# Patient Record
Sex: Female | Born: 1955 | Hispanic: Yes | Marital: Married | Smoking: Never smoker
Health system: Southern US, Community
[De-identification: ages and names within clinical notes are randomized; demographics above are authoritative.]

## PROBLEM LIST (undated history)

## (undated) DIAGNOSIS — E119 Type 2 diabetes mellitus without complications: Secondary | ICD-10-CM

## (undated) DIAGNOSIS — E079 Disorder of thyroid, unspecified: Secondary | ICD-10-CM

---

## 2006-05-06 ENCOUNTER — Ambulatory Visit: Payer: Self-pay

## 2006-05-12 ENCOUNTER — Encounter: Payer: Self-pay | Admitting: Family Medicine

## 2006-05-16 ENCOUNTER — Encounter: Payer: Self-pay | Admitting: Family Medicine

## 2007-09-08 ENCOUNTER — Ambulatory Visit: Payer: Self-pay

## 2013-08-18 ENCOUNTER — Ambulatory Visit: Payer: Self-pay

## 2014-04-27 DIAGNOSIS — M2550 Pain in unspecified joint: Secondary | ICD-10-CM | POA: Insufficient documentation

## 2014-04-27 DIAGNOSIS — G5600 Carpal tunnel syndrome, unspecified upper limb: Secondary | ICD-10-CM | POA: Insufficient documentation

## 2014-04-27 DIAGNOSIS — R768 Other specified abnormal immunological findings in serum: Secondary | ICD-10-CM | POA: Insufficient documentation

## 2016-07-29 ENCOUNTER — Encounter: Payer: Self-pay | Admitting: Emergency Medicine

## 2016-07-29 ENCOUNTER — Emergency Department
Admission: EM | Admit: 2016-07-29 | Discharge: 2016-07-29 | Disposition: A | Discharge status: Home / Self Care | Payer: Self-pay | Attending: Emergency Medicine | Admitting: Emergency Medicine

## 2016-07-29 DIAGNOSIS — E119 Type 2 diabetes mellitus without complications: Secondary | ICD-10-CM | POA: Insufficient documentation

## 2016-07-29 DIAGNOSIS — R21 Rash and other nonspecific skin eruption: Secondary | ICD-10-CM | POA: Insufficient documentation

## 2016-07-29 HISTORY — DX: Disorder of thyroid, unspecified: E07.9

## 2016-07-29 HISTORY — DX: Type 2 diabetes mellitus without complications: E11.9

## 2016-07-29 MED ORDER — HYDROXYZINE HCL 25 MG PO TABS: 25.0000 mg | ORAL_TABLET | Freq: Three times a day (TID) | ORAL | 0 refills | Status: AC | PRN

## 2016-07-29 MED ORDER — PREDNISONE 10 MG PO TABS: 60.0000 mg | ORAL_TABLET | Freq: Every day | ORAL | 0 refills | Status: AC

## 2016-07-29 NOTE — Discharge Instructions (Signed)
Please watch your blood sugar closely while you are taking the Prednisone. It may go up. Stay on a strict diabetic diet.  Follow up with the dermatologist if symptoms are not improving over the next 2 weeks.   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

## 2016-07-29 NOTE — ED Notes (Signed)
See triage note   States she developed generalized rash with itching  Was seen by PCP about 1 month ago   But meds did not work  No resp issues noted

## 2016-07-29 NOTE — ED Notes (Signed)
Pt needs interpreter.

## 2016-07-29 NOTE — ED Triage Notes (Addendum)
Pt states that she has had a rash x4 months and that now she is not able to sleep at night due to itching of rash. Pt has rash on her lower left arm and most of the left leg. Pt is ambulatory to triage with NAD noted at this time.

## 2016-07-29 NOTE — ED Provider Notes (Signed)
[facility] Emergency Department Provider Note  ____________________________________________  Time seen: Approximately 7:24 AM  I have reviewed the triage vital signs and the nursing notes.   HISTORY  Chief Complaint Rash   HPI Illana Shamar Kracke is a 61 y.o. female who presents to the emergency department for treatment of a rash that has been present for the past 4 months. Rash started on knees and has now spread "all over." Itching no worse at night than through the day. She has been seen at "the clinic" and was given a pill to be taken as needed and a cream to put on the rash. She used it for a month without any relief.   Past Medical History:  Diagnosis Date  . Diabetes mellitus without complication ([place])   . Thyroid disease     There are no active problems to display for this patient.   History reviewed. No pertinent surgical history.  Prior to Admission medications   Medication Sig Start Date End Date Taking? Authorizing Provider  hydrOXYzine (ATARAX/VISTARIL) 25 MG tablet Take 1 tablet (25 mg total) by mouth 3 (three) times daily as needed. 07/29/16   Victorino Dike, FNP  predniSONE (DELTASONE) 10 MG tablet Take 6 tablets (60 mg total) by mouth daily. 07/29/16   Victorino Dike, FNP    Allergies Patient has no known allergies.  No family history on file.  Social History Social History  Substance Use Topics  . Smoking status: Never Smoker  . Smokeless tobacco: Never Used  . Alcohol use No    Review of Systems  Constitutional: Negative for fever/chills Respiratory: Negative for shortness of breath. Musculoskeletal: Negative for pain. Skin: Positive for widespread rash. Neurological: Negative for headaches, focal weakness or numbness. ____________________________________________   PHYSICAL EXAM:  VITAL SIGNS: ED Triage Vitals  Enc Vitals Group     BP 07/29/16 0530 (!) 145/55     Pulse Rate 07/29/16 0530 74     Resp  07/29/16 0530 18     Temp 07/29/16 0530 98.2 F (36.8 C)     Temp Source 07/29/16 0530 Oral     SpO2 07/29/16 0530 96 %     Weight 07/29/16 0531 183 lb (83 kg)     Height 07/29/16 0531 5\' 3"  (1.6 m)     Head Circumference --      Peak Flow --      Pain Score 07/29/16 0529 8     Pain Loc --      Pain Edu? --      Excl. in [place]? --      Constitutional: Alert and oriented. Well appearing and in no acute distress. Eyes: Conjunctivae are normal. EOMI. Nose: No congestion/rhinnorhea. Mouth/Throat: Mucous membranes are moist.   Neck: No stridor. Lymphatic: No cervical lymphadenopathy. Cardiovascular: Good peripheral circulation. Respiratory: Normal respiratory effort.  No retractions. Lungs clear. Musculoskeletal: FROM throughout. Neurologic:  Normal speech and language. No gross focal neurologic deficits are appreciated. Skin: Maculopapular, confluent rash on erythematous base covering the trunk and all 4 extremities, sparing the face, scalp, neck, hands, and feet.  ____________________________________________   LABS (all labs ordered are listed, but only abnormal results are displayed)  Labs Reviewed - No data to display ____________________________________________  EKG   ____________________________________________  RADIOLOGY  Not indicated. ____________________________________________   PROCEDURES  Procedure(s) performed: None ____________________________________________   INITIAL IMPRESSION / ASSESSMENT AND PLAN / ED COURSE   Pertinent labs & imaging results that were available during my care  of the patient were reviewed by me and considered in my medical decision making (see chart for details).   ____________________________________________   FINAL CLINICAL IMPRESSION(S) / ED DIAGNOSES  Final diagnoses:  Rash and nonspecific skin eruption    New Prescriptions   HYDROXYZINE (ATARAX/VISTARIL) 25 MG TABLET    Take 1 tablet (25 mg total) by mouth 3  (three) times daily as needed.   PREDNISONE (DELTASONE) 10 MG TABLET    Take 6 tablets (60 mg total) by mouth daily.    If controlled substance prescribed during this visit, 12 month history viewed on the [place] prior to issuing an initial prescription for Schedule II or III opiod.   Note:  This document was prepared using Dragon voice recognition software and may include unintentional dictation errors.    Victorino Dike, FNP 07/29/16 [place], MD 07/30/16 [phone]

## 2017-01-02 ENCOUNTER — Other Ambulatory Visit: Payer: Self-pay | Admitting: Family Medicine

## 2017-01-02 DIAGNOSIS — Z1231 Encounter for screening mammogram for malignant neoplasm of breast: Secondary | ICD-10-CM

## 2019-01-19 ENCOUNTER — Other Ambulatory Visit: Payer: Self-pay | Admitting: Family Medicine

## 2019-01-19 DIAGNOSIS — Z1231 Encounter for screening mammogram for malignant neoplasm of breast: Secondary | ICD-10-CM

## 2019-04-21 ENCOUNTER — Ambulatory Visit
Admission: RE | Admit: 2019-04-21 | Discharge: 2019-04-21 | Disposition: A | Discharge status: Home / Self Care | Payer: Medicare Other | Source: Ambulatory Visit | Attending: Family Medicine | Admitting: Family Medicine

## 2019-04-21 DIAGNOSIS — Z1231 Encounter for screening mammogram for malignant neoplasm of breast: Secondary | ICD-10-CM | POA: Insufficient documentation

## 2019-11-19 ENCOUNTER — Other Ambulatory Visit: Payer: Self-pay | Admitting: Family Medicine

## 2019-11-19 DIAGNOSIS — Z13820 Encounter for screening for osteoporosis: Secondary | ICD-10-CM

## 2020-01-04 ENCOUNTER — Other Ambulatory Visit: Payer: Medicare Other

## 2020-01-26 ENCOUNTER — Ambulatory Visit
Admission: RE | Admit: 2020-01-26 | Discharge: 2020-01-26 | Disposition: A | Discharge status: Home / Self Care | Payer: Medicare Other | Source: Ambulatory Visit | Attending: Family Medicine | Admitting: Family Medicine

## 2020-01-26 ENCOUNTER — Other Ambulatory Visit: Payer: Self-pay

## 2020-01-26 DIAGNOSIS — Z13820 Encounter for screening for osteoporosis: Secondary | ICD-10-CM | POA: Insufficient documentation

## 2020-01-26 DIAGNOSIS — E039 Hypothyroidism, unspecified: Secondary | ICD-10-CM | POA: Diagnosis not present

## 2020-01-26 DIAGNOSIS — Z780 Asymptomatic menopausal state: Secondary | ICD-10-CM | POA: Diagnosis not present

## 2021-04-16 IMAGING — MG DIGITAL SCREENING BILAT W/ TOMO W/ CAD
8 series · 8 of 24 positions shown · non-contrast
Comparison: Previous exam(s).

CLINICAL DATA: Screening.

EXAM:
DIGITAL SCREENING BILATERAL MAMMOGRAM WITH TOMO AND CAD

[R CC synth-2D]
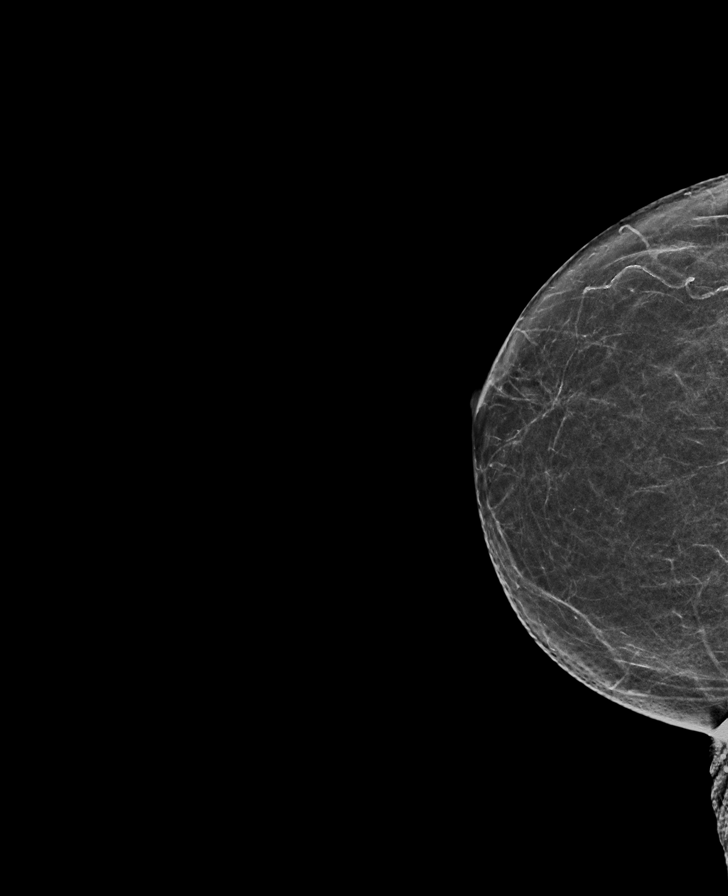

[R MLO synth-2D]
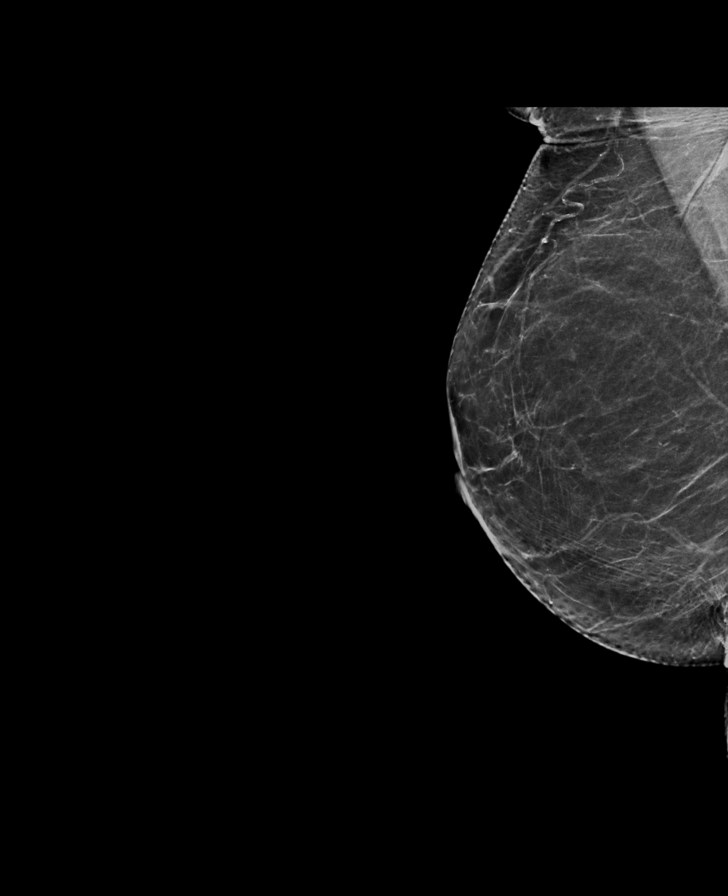

[L MLO synth-2D]
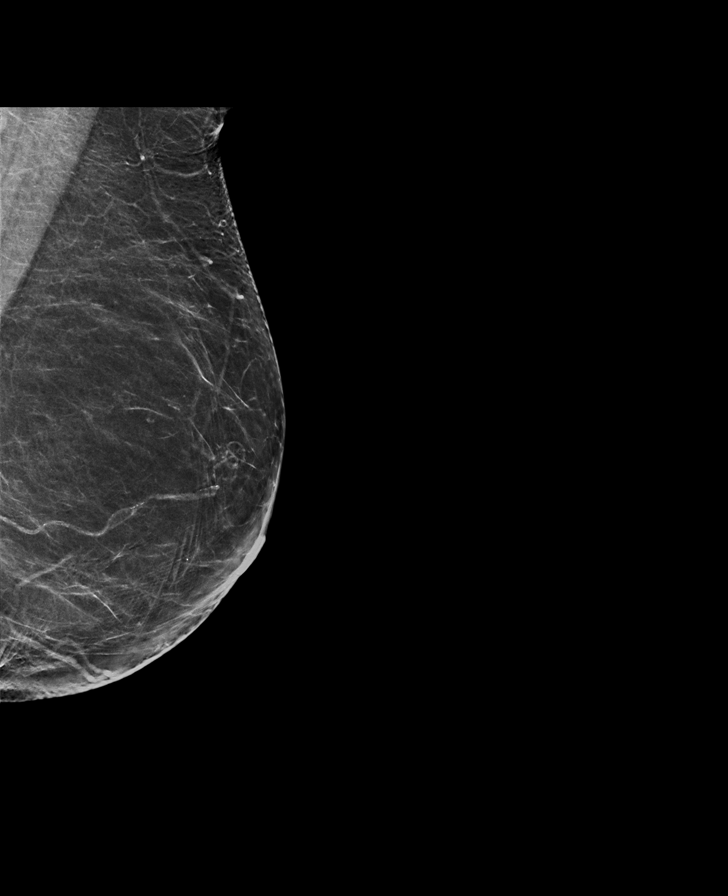

[L CC synth-2D]
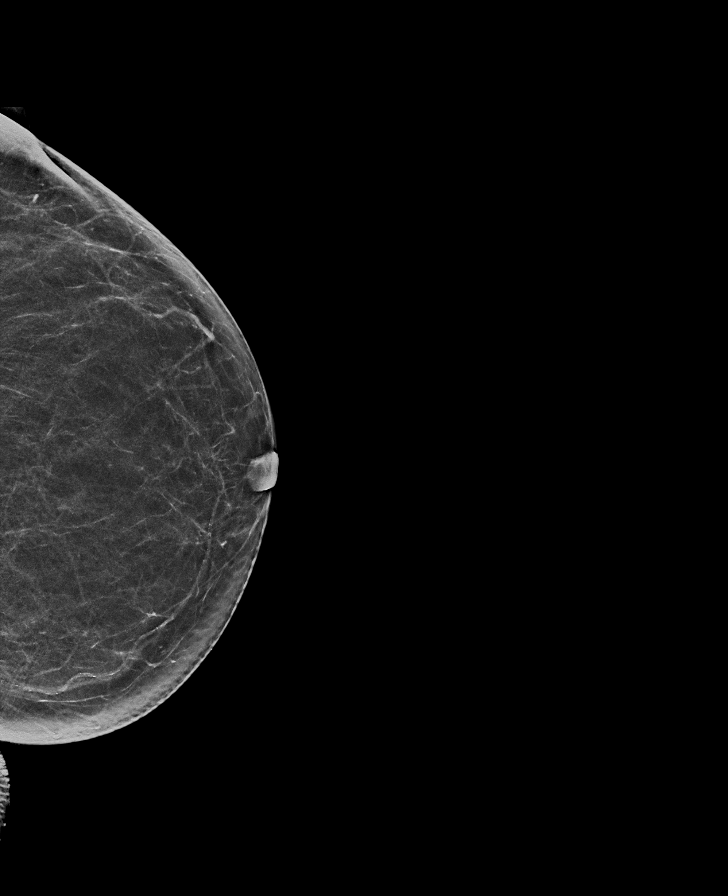

[L CC tomo · tomo slice 31/62.0]
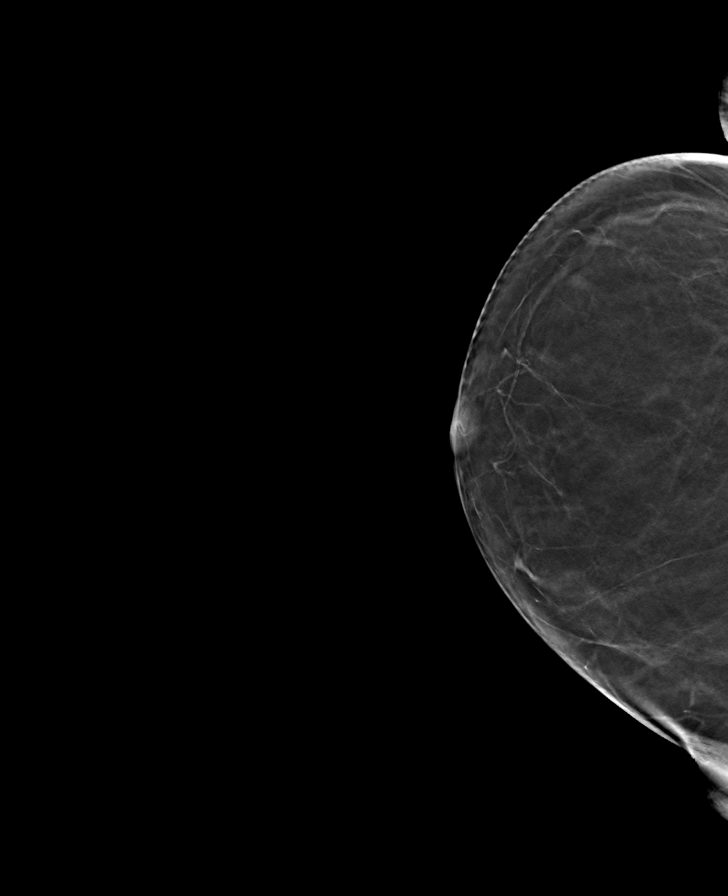

[R CC tomo · tomo slice 29/58.0]
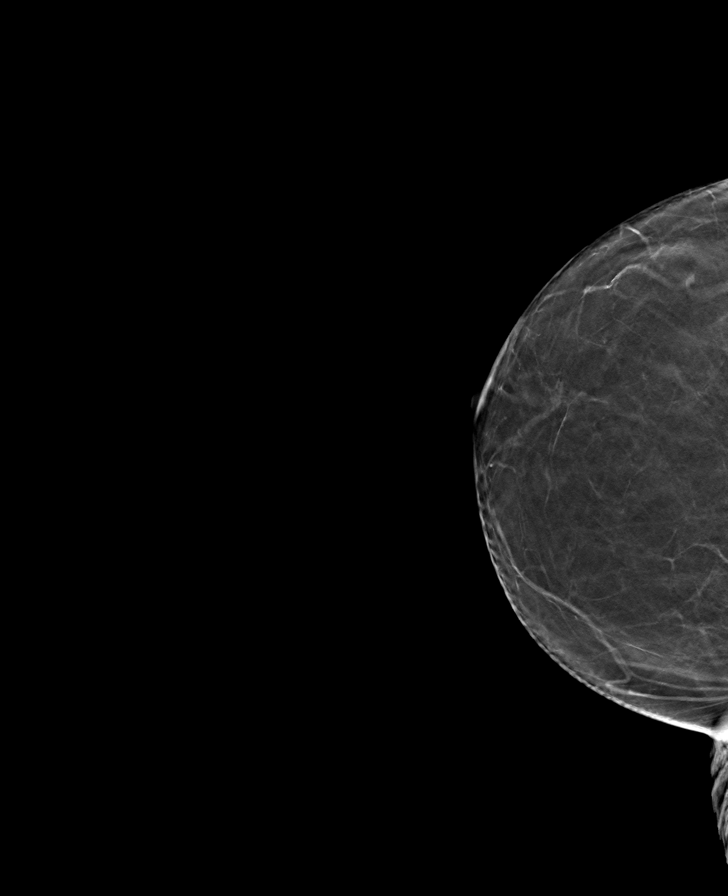

[L MLO tomo · tomo slice 37/72.0]
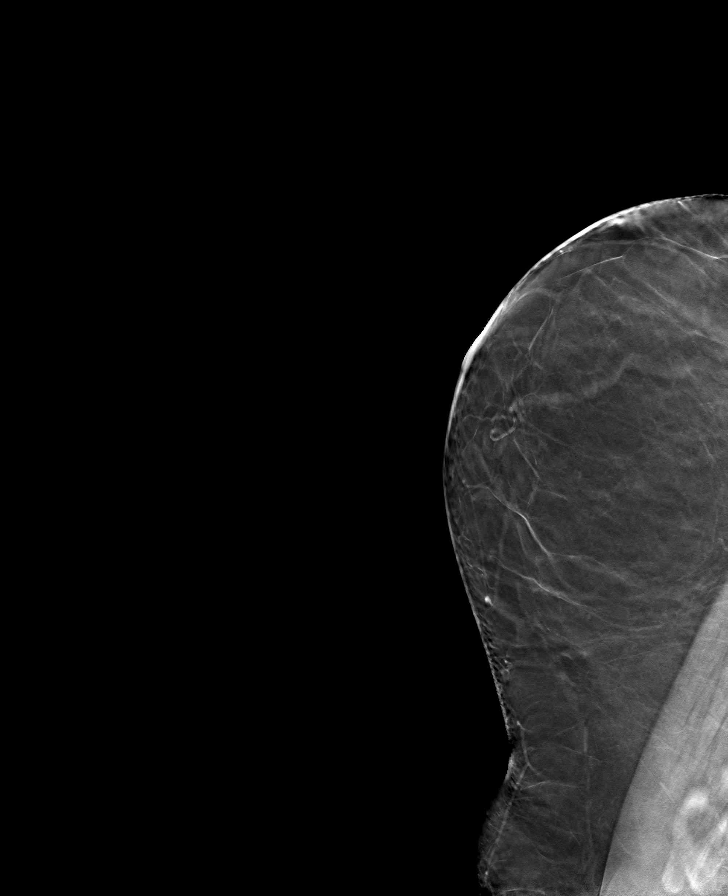

[R MLO tomo · tomo slice 33/65.0]
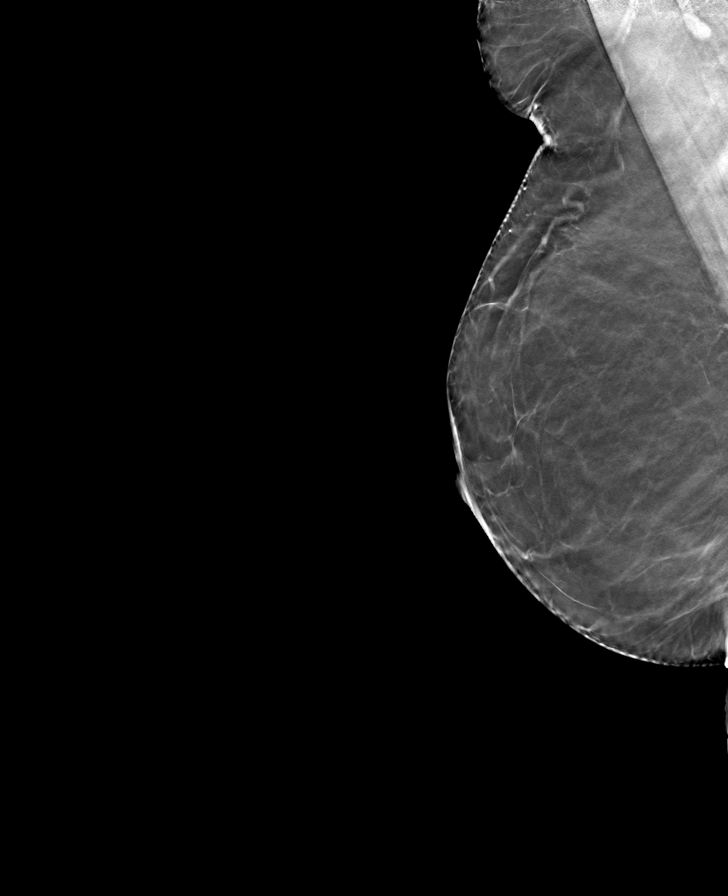

[8 of 24 positions shown; findings below may reference images not displayed]

ACR Breast Density Category b: There are scattered areas of
fibroglandular density.
FINDINGS: There are no findings suspicious for malignancy. Images were
processed with CAD.
IMPRESSION: No mammographic evidence of malignancy. A result letter of this
screening mammogram will be mailed directly to the patient.

RECOMMENDATION:
Screening mammogram in one year. (Code:[id number]-U-775)

BI-RADS CATEGORY  1: Negative.

## 2022-01-08 ENCOUNTER — Other Ambulatory Visit: Payer: Self-pay | Admitting: Nurse Practitioner

## 2022-01-08 DIAGNOSIS — E782 Mixed hyperlipidemia: Secondary | ICD-10-CM | POA: Insufficient documentation

## 2022-01-08 DIAGNOSIS — E119 Type 2 diabetes mellitus without complications: Secondary | ICD-10-CM | POA: Insufficient documentation

## 2022-01-08 DIAGNOSIS — Z1231 Encounter for screening mammogram for malignant neoplasm of breast: Secondary | ICD-10-CM

## 2022-01-28 ENCOUNTER — Other Ambulatory Visit: Payer: Self-pay

## 2022-01-28 ENCOUNTER — Telehealth: Payer: Self-pay

## 2022-01-28 DIAGNOSIS — Z1211 Encounter for screening for malignant neoplasm of colon: Secondary | ICD-10-CM

## 2022-01-28 MED ORDER — NA SULFATE-K SULFATE-MG SULF 17.5-3.13-1.6 GM/177ML PO SOLN: 1.0000 | Freq: Once | ORAL | 0 refills | Status: AC

## 2022-01-28 NOTE — Telephone Encounter (Signed)
Gastroenterology Pre-Procedure Review  Request Date: 02/21/22 Requesting Physician: Dr. Vicente Males  PATIENT REVIEW QUESTIONS: The patient gave verbal permission for her daughter Mickel Baas to complete triage .   1. Are you having any GI issues? no 2. Do you have a personal history of Polyps? no 3. Do you have a family history of Colon Cancer or Polyps? no 4. Diabetes Mellitus? yes (type 2 has been advised to hold Metformin 2 days prior to colonoscopy, Jardiance 3 days prior to colonoscopy.) 5. Joint replacements in the past 12 months?no 6. Major health problems in the past 3 months?no 7. Any artificial heart valves, MVP, or defibrillator?no    MEDICATIONS & ALLERGIES:    Patient reports the following regarding taking any anticoagulation/antiplatelet therapy:   Plavix, Coumadin, Eliquis, Xarelto, Lovenox, Pradaxa, Brilinta, or Effient? no Aspirin? no  Patient confirms/reports the following medications:  Current Outpatient Medications  Medication Sig Dispense Refill   hydrOXYzine (ATARAX/VISTARIL) 25 MG tablet Take 1 tablet (25 mg total) by mouth 3 (three) times daily as needed. 30 tablet 0   predniSONE (DELTASONE) 10 MG tablet Take 6 tablets (60 mg total) by mouth daily. 63 tablet 0   No current facility-administered medications for this visit.    Patient confirms/reports the following allergies:  No Known Allergies  No orders of the defined types were placed in this encounter.   AUTHORIZATION INFORMATION Primary Insurance: 1D#: Group #:  Secondary Insurance: 1D#: Group #:  SCHEDULE INFORMATION: Date: 03/04/22 Time: Location: [facility]

## 2022-02-21 ENCOUNTER — Ambulatory Visit: Payer: Medicare Other | Admitting: Certified Registered Nurse Anesthetist

## 2022-02-21 ENCOUNTER — Ambulatory Visit
Admission: RE | Admit: 2022-02-21 | Discharge: 2022-02-21 | Disposition: A | Discharge status: Home / Self Care | Payer: Medicare Other | Source: Ambulatory Visit | Attending: Gastroenterology | Admitting: Gastroenterology

## 2022-02-21 ENCOUNTER — Other Ambulatory Visit: Payer: Self-pay

## 2022-02-21 ENCOUNTER — Encounter
Admission: RE | Disposition: A | Discharge status: Home / Self Care | Payer: Self-pay | Source: Ambulatory Visit | Attending: Gastroenterology

## 2022-02-21 DIAGNOSIS — Z79890 Hormone replacement therapy (postmenopausal): Secondary | ICD-10-CM | POA: Insufficient documentation

## 2022-02-21 DIAGNOSIS — Z1211 Encounter for screening for malignant neoplasm of colon: Secondary | ICD-10-CM | POA: Diagnosis not present

## 2022-02-21 DIAGNOSIS — E119 Type 2 diabetes mellitus without complications: Secondary | ICD-10-CM | POA: Insufficient documentation

## 2022-02-21 DIAGNOSIS — D126 Benign neoplasm of colon, unspecified: Secondary | ICD-10-CM

## 2022-02-21 DIAGNOSIS — E079 Disorder of thyroid, unspecified: Secondary | ICD-10-CM | POA: Insufficient documentation

## 2022-02-21 DIAGNOSIS — E039 Hypothyroidism, unspecified: Secondary | ICD-10-CM | POA: Diagnosis not present

## 2022-02-21 DIAGNOSIS — D122 Benign neoplasm of ascending colon: Secondary | ICD-10-CM | POA: Insufficient documentation

## 2022-02-21 DIAGNOSIS — Z7984 Long term (current) use of oral hypoglycemic drugs: Secondary | ICD-10-CM | POA: Diagnosis not present

## 2022-02-21 DIAGNOSIS — Z87891 Personal history of nicotine dependence: Secondary | ICD-10-CM | POA: Insufficient documentation

## 2022-02-21 HISTORY — PX: COLONOSCOPY WITH PROPOFOL: SHX5780

## 2022-02-21 LAB — GLUCOSE, CAPILLARY: Glucose-Capillary: 134 mg/dL — ABNORMAL HIGH (ref 70–99)

## 2022-02-21 SURGERY — COLONOSCOPY WITH PROPOFOL
Anesthesia: General

## 2022-02-21 MED ORDER — SODIUM CHLORIDE 0.9 % IV SOLN: INTRAVENOUS | Status: DC

## 2022-02-21 MED ORDER — PROPOFOL 10 MG/ML IV BOLUS
INTRAVENOUS | Status: DC | PRN
  Administered 2022-02-21: 50 mg via INTRAVENOUS

## 2022-02-21 MED ORDER — PROPOFOL 500 MG/50ML IV EMUL
INTRAVENOUS | Status: DC | PRN
  Administered 2022-02-21: 140 ug/kg/min via INTRAVENOUS

## 2022-02-21 MED ORDER — STERILE WATER FOR IRRIGATION IR SOLN
Status: DC | PRN
  Administered 2022-02-21: 50 mL

## 2022-02-21 MED ORDER — LIDOCAINE HCL (CARDIAC) PF 100 MG/5ML IV SOSY
PREFILLED_SYRINGE | INTRAVENOUS | Status: DC | PRN
  Administered 2022-02-21: 50 mg via INTRAVENOUS

## 2022-02-21 NOTE — Transfer of Care (Signed)
Immediate Anesthesia Transfer of Care Note  Patient: Danielle Noble  Procedure(s) Performed: COLONOSCOPY WITH PROPOFOL  Patient Location: PACU and Endoscopy Unit  Anesthesia Type:General  Level of Consciousness: drowsy  Airway & Oxygen Therapy: Patient Spontanous Breathing  Post-op Assessment: Report given to RN and Post -op Vital signs reviewed and stable  Post vital signs: Reviewed and stable  Last Vitals:  Vitals Value Taken Time  BP 119/55   Temp    Pulse 65   Resp 15   SpO2 100     Last Pain:  Vitals:   02/21/22 0955  TempSrc: Temporal  PainSc: 0-No pain         Complications: No notable events documented.

## 2022-02-21 NOTE — Anesthesia Preprocedure Evaluation (Signed)
Anesthesia Evaluation  Patient identified by MRN, date of birth, ID band Patient awake    Reviewed: Allergy & Precautions, NPO status , Patient's Chart, lab work & pertinent test results  History of Anesthesia Complications Negative for: history of anesthetic complications  Airway Mallampati: II  TM Distance: >3 FB Neck ROM: Full    Dental no notable dental hx. (+) Teeth Intact   Pulmonary neg pulmonary ROS, neg sleep apnea, neg COPD, Patient abstained from smoking.Not current smoker   Pulmonary exam normal breath sounds clear to auscultation       Cardiovascular Exercise Tolerance: Good METS(-) hypertension(-) CAD and (-) Past MI negative cardio ROS (-) dysrhythmias  Rhythm:Regular Rate:Normal - Systolic murmurs    Neuro/Psych negative neurological ROS  negative psych ROS   GI/Hepatic ,neg GERD  ,,(+)     (-) substance abuse    Endo/Other  diabetes, Oral Hypoglycemic AgentsHypothyroidism    Renal/GU negative Renal ROS     Musculoskeletal   Abdominal   Peds  Hematology   Anesthesia Other Findings Past Medical History: No date: Diabetes mellitus without complication (HCC) No date: Thyroid disease  Reproductive/Obstetrics                              Anesthesia Physical Anesthesia Plan  ASA: 2  Anesthesia Plan: General   Post-op Pain Management: Minimal or no pain anticipated   Induction: Intravenous  PONV Risk Score and Plan: 3 and Propofol infusion, TIVA and Ondansetron  Airway Management Planned: Nasal Cannula  Additional Equipment: None  Intra-op Plan:   Post-operative Plan:   Informed Consent: I have reviewed the patients History and Physical, chart, labs and discussed the procedure including the risks, benefits and alternatives for the proposed anesthesia with the patient or authorized representative who has indicated his/her understanding and acceptance.      Dental advisory given and Interpreter used for interveiw (in person spanish interpreter [place] at bedside)  Plan Discussed with: CRNA and Surgeon  Anesthesia Plan Comments: (Discussed risks of anesthesia with patient, including possibility of difficulty with spontaneous ventilation under anesthesia necessitating airway intervention, PONV, and rare risks such as cardiac or respiratory or neurological events, and allergic reactions. Discussed the role of CRNA in patient's perioperative care. Patient understands.)         Anesthesia Quick Evaluation

## 2022-02-21 NOTE — Anesthesia Postprocedure Evaluation (Signed)
Anesthesia Post Note  Patient: Danielle Noble  Procedure(s) Performed: COLONOSCOPY WITH PROPOFOL  Patient location during evaluation: Endoscopy Anesthesia Type: General Level of consciousness: awake and alert Pain management: pain level controlled Vital Signs Assessment: post-procedure vital signs reviewed and stable Respiratory status: spontaneous breathing, nonlabored ventilation, respiratory function stable and patient connected to nasal cannula oxygen Cardiovascular status: blood pressure returned to baseline and stable Postop Assessment: no apparent nausea or vomiting Anesthetic complications: no   No notable events documented.   Last Vitals:  Vitals:   02/21/22 1050 02/21/22 1102  BP: (!) 119/55 (!) 125/53  Pulse: 65 (!) 59  Resp: 19 15  Temp: (!) 36.2 C   SpO2: 100% 99%    Last Pain:  Vitals:   02/21/22 1102  TempSrc:   PainSc: 0-No pain                 Arita Miss

## 2022-02-21 NOTE — H&P (Signed)
Danielle Bellows, MD [address] [place], [place], [place], [address] [address], [place], [place], [place], [address] Phone: [phone]  Fax: [phone]  Primary Care Physician:  Danielle Burrow, MD   Pre-Procedure History & Physical: HPI:  Danielle Noble is a 66 y.o. female is here for an colonoscopy.   Past Medical History:  Diagnosis Date   Diabetes mellitus without complication ([place])    Thyroid disease     No past surgical history on file.  Prior to Admission medications   Medication Sig Start Date End Date Taking? Authorizing Provider  atorvastatin (LIPITOR) 40 MG tablet  12/14/21   [provider]  diclofenac Sodium (VOLTAREN) 1 % GEL Apply 2 g topically 4 (four) times daily. 01/08/22 01/08/23  [provider]  empagliflozin (JARDIANCE) 10 MG TABS tablet  08/01/21   [provider]  glucose blood (PRECISION QID TEST) test strip 1 each (1 strip total) once daily Use as instructed. 01/08/22 01/08/23  [provider]  hydrOXYzine (ATARAX/VISTARIL) 25 MG tablet Take 1 tablet (25 mg total) by mouth 3 (three) times daily as needed. Patient not taking: Reported on 02/21/2022 07/29/16   Danielle Dike, FNP  levothyroxine (SYNTHROID) 88 MCG tablet  12/14/21   [provider]  lisinopril (ZESTRIL) 40 MG tablet  12/14/21   [provider]  metFORMIN (GLUMETZA) 500 MG (MOD) 24 hr tablet Take by mouth.    [provider]  predniSONE (DELTASONE) 10 MG tablet Take 6 tablets (60 mg total) by mouth daily. 07/29/16   Danielle George B, FNP    Allergies as of 01/28/2022   (No Known Allergies)    Family History  Problem Relation Age of Onset   Breast cancer Cousin     Social History   Socioeconomic History   Marital status: Married    Spouse name: Not on file   Number of children: Not on file   Years of education: Not on file   Highest education level: Not on file  Occupational History   Not on file   Tobacco Use   Smoking status: Never   Smokeless tobacco: Never  Substance and Sexual Activity   Alcohol use: No   Drug use: No   Sexual activity: Not on file  Other Topics Concern   Not on file  Social History Narrative   Not on file   Social Determinants of Health   Financial Resource Strain: Not on file  Food Insecurity: Not on file  Transportation Needs: Not on file  Physical Activity: Not on file  Stress: Not on file  Social Connections: Not on file  Intimate Partner Violence: Not on file    Review of Systems: See HPI, otherwise negative ROS  Physical Exam: BP (!) 149/62   Pulse 64   Temp (!) 97 F (36.1 C) (Temporal)   Resp 18   Ht '5\' 4"'$  (1.626 m)   Wt 70.3 kg   SpO2 100%   BMI 26.61 kg/m  General:   Alert,  pleasant and cooperative in NAD Head:  Normocephalic and atraumatic. Neck:  Supple; no masses or thyromegaly. Lungs:  Clear throughout to auscultation, normal respiratory effort.    Heart:  +S1, +S2, Regular rate and rhythm, No edema. Abdomen:  Soft, nontender and nondistended. Normal bowel sounds, without guarding, and without rebound.   Neurologic:  Alert and  oriented x4;  grossly normal neurologically.  Impression/Plan: Danielle Noble is here for an colonoscopy to  be performed for Screening colonoscopy average risk   Risks, benefits, limitations, and alternatives regarding  colonoscopy have been reviewed with the patient.  Questions have been answered.  All parties agreeable.   Danielle Bellows, MD  02/21/2022, 10:16 AM

## 2022-02-21 NOTE — Op Note (Signed)
[facility] Gastroenterology Patient Name: Danielle Noble Procedure Date: 02/21/2022 10:16 AM MRN: [id number] Account #: [id number] Date of Birth: 09/19/1955 Admit Type: Outpatient Age: 66 Room: [facility] ENDO ROOM 4 Gender: Female Note Status: Finalized Instrument Name: Jasper Riling [id number] Procedure:             Colonoscopy Indications:           Screening for colorectal malignant neoplasm Providers:             [name] MD, MD Referring MD:          Elyse Jarvis Revelo (Referring MD) Medicines:             Monitored Anesthesia Care Complications:         No immediate complications. Procedure:             Pre-Anesthesia Assessment:                        - Prior to the procedure, a History and Physical was                         performed, and patient medications, allergies and                         sensitivities were reviewed. The patient's tolerance                         of previous anesthesia was reviewed.                        - The risks and benefits of the procedure and the                         sedation options and risks were discussed with the                         patient. All questions were answered and informed                         consent was obtained.                        - ASA Grade Assessment: II - A patient with mild                         systemic disease.                        After obtaining informed consent, the colonoscope was                         passed under direct vision. Throughout the procedure,                         the patient's blood pressure, pulse, and oxygen                         saturations were monitored continuously. The                         Colonoscope was introduced  through the anus and                         advanced to the the cecum, identified by the                         appendiceal orifice. The colonoscopy was performed                         with ease. The patient tolerated the procedure  well.                         The quality of the bowel preparation was good. The                         appendiceal orifice was photographed. Findings:      The perianal and digital rectal examinations were normal.      Two sessile polyps were found in the ascending colon. The polyps were 4       to 5 mm in size. These polyps were removed with a cold snare. Resection       and retrieval were complete.      The exam was otherwise without abnormality on direct and retroflexion       views. Impression:            - Two 4 to 5 mm polyps in the ascending colon, removed                         with a cold snare. Resected and retrieved.                        - The examination was otherwise normal on direct and                         retroflexion views. Recommendation:        - Discharge patient to home (with escort).                        - Resume previous diet.                        - Continue present medications.                        - Await pathology results.                        - Repeat colonoscopy for surveillance based on                         pathology results. Procedure Code(s):     --- Professional ---                        [phone], Colonoscopy, flexible; with removal of                         tumor(s), polyp(s), or other lesion(s) by snare  technique Diagnosis Code(s):     --- Professional ---                        Z12.11, Encounter for screening for malignant neoplasm                         of colon                        D12.2, Benign neoplasm of ascending colon CPT copyright 2022 American Medical Association. All rights reserved. The codes documented in this report are preliminary and upon coder review may  be revised to meet current compliance requirements. [name], MD [name] MD, MD 02/21/2022 10:50:28 AM This report has been signed electronically. Number of Addenda: 0 Note Initiated On: 02/21/2022 10:16 AM Scope Withdrawal Time: 0  hours 10 minutes 49 seconds  Total Procedure Duration: 0 hours 15 minutes 0 seconds  Estimated Blood Loss:  Estimated blood loss: none.      [facility]

## 2022-02-21 NOTE — Anesthesia Procedure Notes (Signed)
Date/Time: 02/21/2022 10:35 AM  Performed by: Lily Peer, Evelise Reine, CRNAPre-anesthesia Checklist: Patient identified, Emergency Drugs available, Suction available, Patient being monitored and Timeout performed Patient Re-evaluated:Patient Re-evaluated prior to induction Oxygen Delivery Method: Nasal cannula Induction Type: IV induction

## 2022-02-22 ENCOUNTER — Encounter: Payer: Self-pay | Admitting: Gastroenterology

## 2022-02-22 LAB — SURGICAL PATHOLOGY

## 2022-02-27 ENCOUNTER — Encounter: Payer: Self-pay | Admitting: Gastroenterology

## 2022-12-10 ENCOUNTER — Other Ambulatory Visit: Payer: Self-pay | Admitting: Nurse Practitioner

## 2022-12-10 DIAGNOSIS — Z1231 Encounter for screening mammogram for malignant neoplasm of breast: Secondary | ICD-10-CM

## 2022-12-31 ENCOUNTER — Ambulatory Visit
Admission: RE | Admit: 2022-12-31 | Discharge: 2022-12-31 | Disposition: A | Discharge status: Home / Self Care | Payer: Medicare Other | Source: Ambulatory Visit | Attending: Nurse Practitioner | Admitting: Nurse Practitioner

## 2022-12-31 DIAGNOSIS — Z1231 Encounter for screening mammogram for malignant neoplasm of breast: Secondary | ICD-10-CM | POA: Diagnosis present

## 2023-11-21 ENCOUNTER — Other Ambulatory Visit: Payer: Self-pay | Admitting: Family Medicine

## 2023-11-21 DIAGNOSIS — Z1231 Encounter for screening mammogram for malignant neoplasm of breast: Secondary | ICD-10-CM

## 2023-12-25 ENCOUNTER — Emergency Department

## 2023-12-25 ENCOUNTER — Emergency Department
Admission: EM | Admit: 2023-12-25 | Discharge: 2023-12-26 | Disposition: A | Discharge status: Home / Self Care | Attending: Emergency Medicine | Admitting: Emergency Medicine

## 2023-12-25 ENCOUNTER — Other Ambulatory Visit: Payer: Self-pay

## 2023-12-25 ENCOUNTER — Encounter: Payer: Self-pay | Admitting: Emergency Medicine

## 2023-12-25 DIAGNOSIS — E119 Type 2 diabetes mellitus without complications: Secondary | ICD-10-CM | POA: Insufficient documentation

## 2023-12-25 DIAGNOSIS — N390 Urinary tract infection, site not specified: Secondary | ICD-10-CM | POA: Insufficient documentation

## 2023-12-25 DIAGNOSIS — R531 Weakness: Secondary | ICD-10-CM | POA: Insufficient documentation

## 2023-12-25 DIAGNOSIS — R200 Anesthesia of skin: Secondary | ICD-10-CM | POA: Insufficient documentation

## 2023-12-25 DIAGNOSIS — Z79899 Other long term (current) drug therapy: Secondary | ICD-10-CM | POA: Insufficient documentation

## 2023-12-25 DIAGNOSIS — E039 Hypothyroidism, unspecified: Secondary | ICD-10-CM | POA: Insufficient documentation

## 2023-12-25 DIAGNOSIS — R079 Chest pain, unspecified: Secondary | ICD-10-CM | POA: Diagnosis present

## 2023-12-25 LAB — COMPREHENSIVE METABOLIC PANEL WITH GFR
ALT: 16 U/L (ref 0–44)
AST: 15 U/L (ref 15–41)
Albumin: 4.2 g/dL (ref 3.5–5.0)
Alkaline Phosphatase: 77 U/L (ref 38–126)
Anion gap: 12 (ref 5–15)
BUN: 17 mg/dL (ref 8–23)
CO2: 23 mmol/L (ref 22–32)
Calcium: 9.4 mg/dL (ref 8.9–10.3)
Chloride: 103 mmol/L (ref 98–111)
Creatinine, Ser: 0.97 mg/dL (ref 0.44–1.00)
GFR, Estimated: >60 mL/min (ref >60)
Glucose, Bld: 162 mg/dL — ABNORMAL HIGH (ref 70–99)
Potassium: 4.1 mmol/L (ref 3.5–5.1)
Sodium: 138 mmol/L (ref 135–145)
Total Bilirubin: 0.6 mg/dL (ref 0.0–1.2)
Total Protein: 7.7 g/dL (ref 6.5–8.1)

## 2023-12-25 LAB — DIFFERENTIAL
Abs Immature Granulocytes: 0.03 K/uL (ref 0.00–0.07)
Basophils Absolute: 0 K/uL (ref 0.0–0.1)
Basophils Relative: 0 %
Eosinophils Absolute: 0.1 K/uL (ref 0.0–0.5)
Eosinophils Relative: 1 %
Immature Granulocytes: 0 %
Lymphocytes Relative: 48 %
Lymphs Abs: 3.9 K/uL (ref 0.7–4.0)
Monocytes Absolute: 0.6 K/uL (ref 0.1–1.0)
Monocytes Relative: 7 %
Neutro Abs: 3.6 K/uL (ref 1.7–7.7)
Neutrophils Relative %: 44 %

## 2023-12-25 LAB — PROTIME-INR
INR: 0.9 (ref 0.8–1.2)
Prothrombin Time: 12.7 s (ref 11.4–15.2)

## 2023-12-25 LAB — CBC
HCT: 39 % (ref 36.0–46.0)
Hemoglobin: 12.9 g/dL (ref 12.0–15.0)
MCH: 31.1 pg (ref 26.0–34.0)
MCHC: 33.1 g/dL (ref 30.0–36.0)
MCV: 94 fL (ref 80.0–100.0)
Platelets: 344 K/uL (ref 150–400)
RBC: 4.15 MIL/uL (ref 3.87–5.11)
RDW: 13.9 % (ref 11.5–15.5)
WBC: 8.2 K/uL (ref 4.0–10.5)
nRBC: 0 % (ref 0.0–0.2)

## 2023-12-25 LAB — APTT: aPTT: 33 s (ref 24–36)

## 2023-12-25 LAB — ETHANOL: Alcohol, Ethyl (B): <15 mg/dL (ref <15)

## 2023-12-25 LAB — TROPONIN I (HIGH SENSITIVITY): Troponin I (High Sensitivity): 4 ng/L (ref <18)

## 2023-12-25 NOTE — ED Triage Notes (Signed)
 Patient to ED via POV for left arm numbness. States tingling in left ear as well. Had numbness on left side of face that has since resolved. [facility] Saturday. States weakness on the left side. Moving all extremities without difficulty. States intermittent blurry vision. Denies changes in her speech.

## 2023-12-25 NOTE — ED Provider Notes (Signed)
 [facility] Provider Note    Event Date/Time   First MD Initiated Contact with Patient 12/25/23 2302     (approximate)   History   Numbness (/)   HPI  Danielle Noble is a 68 y.o. female with history of diabetes, hyperlipidemia, hypothyroidism who presents to the emergency department with complaints of numbness in the left face, arm since Saturday.  Also feels like she is weak on this side.  No headache, head injury.  Has had intermittent chest pain but none currently.  No prior history of stroke.   History provided by patient, family.  They declined Spanish interpreter after being offered multiple times.    Past Medical History:  Diagnosis Date   Diabetes mellitus without complication (HCC)    Thyroid disease     Past Surgical History:  Procedure Laterality Date   COLONOSCOPY WITH PROPOFOL  N/A 02/21/2022   Procedure: COLONOSCOPY WITH PROPOFOL ;  Surgeon: Therisa Bi, MD;  Location: [facility] ENDOSCOPY;  Service: Gastroenterology;  Laterality: N/A;  SPANISH INTERPRETER    MEDICATIONS:  Prior to Admission medications   Medication Sig Start Date End Date Taking? Authorizing Provider  atorvastatin (LIPITOR) 40 MG tablet  12/14/21   [provider]  empagliflozin (JARDIANCE) 10 MG TABS tablet  08/01/21   [provider]  hydrOXYzine  (ATARAX /VISTARIL ) 25 MG tablet Take 1 tablet (25 mg total) by mouth 3 (three) times daily as needed. Patient not taking: Reported on 02/21/2022 07/29/16   Triplett, Cari B, FNP  levothyroxine (SYNTHROID) 88 MCG tablet  12/14/21   [provider]  lisinopril (ZESTRIL) 40 MG tablet  12/14/21   [provider]  metFORMIN (GLUMETZA) 500 MG (MOD) 24 hr tablet Take by mouth.    [provider]  predniSONE  (DELTASONE ) 10 MG tablet Take 6 tablets (60 mg total) by mouth daily. 07/29/16   Herlinda Kirk NOVAK, FNP    Physical Exam   Triage Vital Signs: ED Triage Vitals  Encounter Vitals Group      BP 12/25/23 1847 (!) 150/56     Girls Systolic BP Percentile --      Girls Diastolic BP Percentile --      Boys Systolic BP Percentile --      Boys Diastolic BP Percentile --      Pulse Rate 12/25/23 1847 77     Resp 12/25/23 1847 18     Temp 12/25/23 1847 98.4 F (36.9 C)     Temp Source 12/25/23 1847 Oral     SpO2 12/25/23 1847 100 %     Weight 12/25/23 1848 154 lb 5.2 oz (70 kg)     Height 12/25/23 1848 5' 4 (1.626 m)     Head Circumference --      Peak Flow --      Pain Score 12/25/23 1847 0     Pain Loc --      Pain Education --      Exclude from Growth Chart --     Most recent vital signs: Vitals:   12/25/23 2338 12/26/23 0319  BP: (!) 123/54 (!) 152/82  Pulse: 71 61  Resp: 17 18  Temp: 98 F (36.7 C) 98 F (36.7 C)  SpO2: 100% 99%    CONSTITUTIONAL: Alert, responds appropriately to questions. Well-appearing; well-nourished HEAD: Normocephalic, atraumatic EYES: Conjunctivae clear, pupils appear equal, sclera nonicteric ENT: normal nose; moist mucous membranes NECK: Supple, normal ROM, no meningismus, no cervical spine tenderness or step-off or deformity CARD: RRR; S1  and S2 appreciated RESP: Normal chest excursion without splinting or tachypnea; breath sounds clear and equal bilaterally; no wheezes, no rhonchi, no rales, no hypoxia or respiratory distress, speaking full sentences ABD/GI: Non-distended; soft, non-tender, no rebound, no guarding, no peritoneal signs BACK: The back appears normal EXT: Normal ROM in all joints; no deformity noted, no edema SKIN: Normal color for age and race; warm; no rash on exposed skin NEURO: Moves all extremities equally, normal speech, reports diminished sensation in the face and left arm compared to the right, no drift, normal gait, cranial nerves II through XII intact PSYCH: The patient's mood and manner are appropriate.   ED Results / Procedures / Treatments   LABS: (all labs ordered are listed, but only abnormal results  are displayed) Labs Reviewed  COMPREHENSIVE METABOLIC PANEL WITH GFR - Abnormal; Notable for the following components:      Result Value   Glucose, Bld 162 (*)    All other components within normal limits  URINALYSIS, W/ REFLEX TO CULTURE (INFECTION SUSPECTED) - Abnormal; Notable for the following components:   Color, Urine STRAW (*)    APPearance CLEAR (*)    Glucose, UA >=500 (*)    Leukocytes,Ua MODERATE (*)    Bacteria, UA RARE (*)    All other components within normal limits  URINE CULTURE  PROTIME-INR  APTT  CBC  DIFFERENTIAL  ETHANOL  TROPONIN I (HIGH SENSITIVITY)     EKG:  EKG Interpretation Date/Time:  Thursday December 25 2023 18:52:04 EDT Ventricular Rate:  74 PR Interval:  178 QRS Duration:  88 QT Interval:  396 QTC Calculation: 439 R Axis:   25  Text Interpretation: Normal sinus rhythm Nonspecific ST abnormality Abnormal ECG No previous ECGs available Confirmed by Neomi Neptune [phone]) on 12/25/2023 11:22:27 PM         RADIOLOGY: My personal review and interpretation of imaging: MRI brain shows no stroke.  I have personally reviewed all radiology reports.   MR BRAIN WO CONTRAST Result Date: 12/26/2023 CLINICAL DATA:  Initial evaluation for acute neuro deficit, stroke suspected. EXAM: MRI HEAD WITHOUT CONTRAST TECHNIQUE: Multiplanar, multiecho pulse sequences of the brain and surrounding structures were obtained without intravenous contrast. COMPARISON:  CT from 12/25/2023. FINDINGS: Brain: Cerebral volume within normal limits for age. Few punctate foci of FLAIR hyperintensity noted involving the supratentorial cerebral white matter, nonspecific, but overall minimal in nature, less than is typically seen for age. No abnormal foci of restricted diffusion to suggest acute or subacute ischemia. Gray-white matter differentiation well maintained. No encephalomalacia to suggest chronic cortical infarction or other insult. No foci of susceptibility artifact  indicative of acute or chronic intracranial blood products. No mass lesion, midline shift or mass effect. Ventricles normal in size and morphology without hydrocephalus. No extra-axial fluid collection. Pituitary gland and suprasellar region within normal limits. Vascular: Major intracranial vascular flow voids are well maintained. Skull and upper cervical spine: Craniocervical junction within normal limits. Visualized upper cervical spine demonstrates no significant finding. Bone marrow signal intensity within normal limits. No scalp soft tissue abnormality. Sinuses/Orbits: Globes and orbital soft tissues are within normal limits. Paranasal sinuses are largely clear. No significant mastoid effusion. Other: None. IMPRESSION: Normal brain MRI for age. No acute intracranial abnormality identified. Electronically Signed   By: Morene Hoard M.D.   On: 12/26/2023 02:09   DG Chest Portable 1 View Result Date: 12/25/2023 CLINICAL DATA:  Intermittent chest pain EXAM: PORTABLE CHEST 1 VIEW COMPARISON:  None Available. FINDINGS: The heart size  and mediastinal contours are within normal limits. Both lungs are clear. The visualized skeletal structures are unremarkable. IMPRESSION: No active disease. Electronically Signed   By: Luke Bun M.D.   On: 12/25/2023 23:47   CT HEAD WO CONTRAST Result Date: 12/25/2023 CLINICAL DATA:  Left arm numbness EXAM: CT HEAD WITHOUT CONTRAST TECHNIQUE: Contiguous axial images were obtained from the base of the skull through the vertex without intravenous contrast. RADIATION DOSE REDUCTION: This exam was performed according to the departmental dose-optimization program which includes automated exposure control, adjustment of the mA and/or kV according to patient size and/or use of iterative reconstruction technique. COMPARISON:  None Available. FINDINGS: Brain: No evidence of acute infarction, hemorrhage, hydrocephalus, extra-axial collection or mass lesion/mass effect. Vascular:  No hyperdense vessel or unexpected calcification. Skull: Normal. Negative for fracture or focal lesion. Sinuses/Orbits: No acute finding. Other: None IMPRESSION: Negative non contrasted CT appearance of the brain. Electronically Signed   By: Luke Bun M.D.   On: 12/25/2023 19:07     PROCEDURES:  Critical Care performed: No    Procedures    IMPRESSION / MDM / ASSESSMENT AND PLAN / ED COURSE  I reviewed the triage vital signs and the nursing notes.    Patient here with complaints of left-sided numbness, weakness that started several days ago.  Also intermittent chest pain.  The patient is on the cardiac monitor to evaluate for evidence of arrhythmia and/or significant heart rate changes.   DIFFERENTIAL DIAGNOSIS (includes but not limited to):   Stroke, doubt TIA given symptoms present for several days, differential also includes complex migraine, doubt intracranial hemorrhage, differential also includes electrolyte derangement, UTI   Patient's presentation is most consistent with acute presentation with potential threat to life or bodily function.   PLAN: Will obtain labs, urine, CT head.  If CT head unremarkable, patient will need MRI of the brain.   MEDICATIONS GIVEN IN ED: Medications  cefTRIAXone  (ROCEPHIN ) 2 g in sodium chloride  0.9 % 100 mL IVPB (0 g Intravenous Stopped 12/26/23 0319)     ED COURSE: CT head, chest x-ray reviewed and interpreted by myself and the radiologist and are unremarkable.  Will proceed with MRI of the brain.  Normal hemoglobin, electrolytes, glucose.  Troponin negative.  No indication for serial enzymes given no pain currently and last pain was hours ago.  EKG nonischemic.  Urine appears infected.  Culture pending.  Will give Rocephin .   MRI brain reviewed and interpreted by myself and the radiologist and shows no acute findings.  Patient states she is still having the numbness at this time and that it does involve the face.  Discussed with  patient that I am reassured that her MRI of her brain is negative and I do not feel that this is a TIA and that she does not need admission given negative imaging and reassuring workup here.  She is comfortable plan for discharge home with PCP and neurology follow-up as an outpatient.  Will discharge with Keflex .  Family at bedside is also comfortable with this plan.   At this time, I do not feel there is any life-threatening condition present. I reviewed all nursing notes, vitals, pertinent previous records.  All lab and urine results, EKGs, imaging ordered have been independently reviewed and interpreted by myself.  I reviewed all available radiology reports from any imaging ordered this visit.  Based on my assessment, I feel the patient is safe to be discharged home without further emergent workup and can continue workup  as an outpatient as needed. Discussed all findings, treatment plan as well as usual and customary return precautions.  They verbalize understanding and are comfortable with this plan.  Outpatient follow-up has been provided as needed.  All questions have been answered.    CONSULTS:  none   OUTSIDE RECORDS REVIEWED: Reviewed last internal medicine note in 2023.       FINAL CLINICAL IMPRESSION(S) / ED DIAGNOSES   Final diagnoses:  Left sided numbness  Acute UTI     Rx / DC Orders   ED Discharge Orders          Ordered    cephALEXin  (KEFLEX ) 500 MG capsule  2 times daily        12/26/23 9688             Note:  This document was prepared using Dragon voice recognition software and may include unintentional dictation errors.   Junah Yam, Josette SAILOR, DO 12/26/23 [phone]

## 2023-12-26 ENCOUNTER — Emergency Department

## 2023-12-26 LAB — URINALYSIS, W/ REFLEX TO CULTURE (INFECTION SUSPECTED)
Bilirubin Urine: NEGATIVE
Glucose, UA: >=500 mg/dL — AB
Hgb urine dipstick: NEGATIVE
Ketones, ur: NEGATIVE mg/dL
Nitrite: NEGATIVE
Protein, ur: NEGATIVE mg/dL
Specific Gravity, Urine: 1.015 (ref 1.005–1.030)
pH: 5 (ref 5.0–8.0)

## 2023-12-26 MED ORDER — SODIUM CHLORIDE 0.9 % IV SOLN
2.0000 g | Freq: Once | INTRAVENOUS | Status: AC
  Administered 2023-12-26: 2 g via INTRAVENOUS
  Filled 2023-12-26: qty 20

## 2023-12-26 MED ORDER — CEPHALEXIN 500 MG PO CAPS: 500.0000 mg | ORAL_CAPSULE | Freq: Two times a day (BID) | ORAL | 0 refills | Status: AC

## 2023-12-26 NOTE — ED Notes (Signed)
 Patient transported to MRI

## 2023-12-26 NOTE — Discharge Instructions (Addendum)
 Your urine appeared infected today.  MRI of your brain showed no stroke.  Your lab work was reassuring.  Please follow-up with your primary care doctor.  We have also given you information for follow-up for neurology.

## 2023-12-27 LAB — URINE CULTURE: Culture: <10000 — AB

## 2024-01-05 ENCOUNTER — Ambulatory Visit
Admission: RE | Admit: 2024-01-05 | Discharge: 2024-01-05 | Disposition: A | Discharge status: Home / Self Care | Source: Ambulatory Visit | Attending: Family Medicine | Admitting: Family Medicine

## 2024-01-05 DIAGNOSIS — Z1231 Encounter for screening mammogram for malignant neoplasm of breast: Secondary | ICD-10-CM | POA: Insufficient documentation
